# Patient Record
Sex: Male | Born: 1975 | Race: White | Hispanic: No | Marital: Married
Health system: Southern US, Community
[De-identification: ages and names within clinical notes are randomized; demographics above are authoritative.]

---

## 2009-03-02 ENCOUNTER — Ambulatory Visit: Payer: Self-pay | Admitting: Occupational Medicine

## 2009-03-13 ENCOUNTER — Ambulatory Visit: Payer: Self-pay | Admitting: Family Medicine

## 2010-06-17 ENCOUNTER — Ambulatory Visit (INDEPENDENT_AMBULATORY_CARE_PROVIDER_SITE_OTHER): Payer: BC Managed Care – PPO | Admitting: Family Medicine

## 2010-06-17 ENCOUNTER — Ambulatory Visit: Payer: BC Managed Care – PPO | Admitting: Family Medicine

## 2010-06-17 ENCOUNTER — Encounter: Payer: Self-pay | Admitting: Family Medicine

## 2010-06-17 DIAGNOSIS — L03039 Cellulitis of unspecified toe: Secondary | ICD-10-CM

## 2010-06-22 NOTE — Assessment & Plan Note (Signed)
Summary: LEFT BIG TOE rm 4   Vital Signs:  Patient Profile:   35 Years Old Male CC:      LT great toe pain Height:     71 inches Weight:      271.75 pounds O2 treatment:    Room Air Temp:     97.5 degrees F oral Pulse rate:   64 / minute Resp:     18 per minute BP sitting:   129 / 85  (left arm) Cuff size:   large  Vitals Entered By: Clemens Catholic LPN (June 16, 1608 1:10 PM)                  Updated Prior Medication List: No Medications Current Allergies: No known allergies History of Present Illness Chief Complaint: LT great toe pain History of Present Illness:  Subjective:  Patient complains of two day history of pain in left first toe.  No history of trauma.  REVIEW OF SYSTEMS Constitutional Symptoms      Denies fever, chills, night sweats, weight loss, weight gain, and fatigue.  Eyes       Denies change in vision, eye pain, eye discharge, glasses, contact lenses, and eye surgery. Ear/Nose/Throat/Mouth       Denies hearing loss/aids, change in hearing, ear pain, ear discharge, dizziness, frequent runny nose, frequent nose bleeds, sinus problems, sore throat, hoarseness, and tooth pain or bleeding.  Respiratory       Denies dry cough, productive cough, wheezing, shortness of breath, asthma, bronchitis, and emphysema/COPD.  Cardiovascular       Denies murmurs, chest pain, and tires easily with exhertion.    Gastrointestinal       Denies stomach pain, nausea/vomiting, diarrhea, constipation, blood in bowel movements, and indigestion. Genitourniary       Denies painful urination, kidney stones, and loss of urinary control. Neurological       Denies paralysis, seizures, and fainting/blackouts. Musculoskeletal       Denies muscle pain, joint pain, joint stiffness, decreased range of motion, redness, swelling, muscle weakness, and gout.  Skin       Denies bruising, unusual mles/lumps or sores, and hair/skin or nail changes.  Psych       Denies mood changes,  temper/anger issues, anxiety/stress, speech problems, depression, and sleep problems. Other Comments: pt c/o LT great toe pain x 2days. no injury. he has not taken any OTC meds.   Past History:  Past Medical History: heart murmur  Past Surgical History: ACL Surgery  Family History: Reviewed history from 03/02/2009 and no changes required. Family History of Cardiovascular disorder  Social History: Reviewed history from 03/02/2009 and no changes required. Married Never Smoked Alcohol use-yes - 5-10 beer weekly Drug use-no Regular exercise-no   Objective:  No acute distress  Left first toe:  Mild swelling, erythema, and tenderness at lateral base of nail.  Not fluctuant, no drainage.  Toe has full range of motion  Assessment New Problems: PARONYCHIA, LEFT GREAT TOE (ICD-681.11)  PARONYCHIA; DOES NOT APPEAR INGROWN  Plan New Medications/Changes: CEPHALEXIN 500 MG TABS (CEPHALEXIN) One by mouth three times daily (every 8 hours)  #21 x 0, 06/17/2010, Donna Christen MD NAPROXEN 500 MG TABS (NAPROXEN) One by mouth two times a day pc  #20 x 0, 06/17/2010, Donna Christen MD  New Orders: Est. Patient Level III [96045] Planning Comments:   Begin warm soaks.  Naproxen two times a day; Keflex 500mg  Q8hr.  Given a Water quality scientist patient information and instruction sheet  on topic (also info sheet on ingrown toenails) Return for worsening pain/swelling.   The patient and/or caregiver has been counseled thoroughly with regard to medications prescribed including dosage, schedule, interactions, rationale for use, and possible side effects and they verbalize understanding.  Diagnoses and expected course of recovery discussed and will return if not improved as expected or if the condition worsens. Patient and/or caregiver verbalized understanding.  Prescriptions: CEPHALEXIN 500 MG TABS (CEPHALEXIN) One by mouth three times daily (every 8 hours)  #21 x 0   Entered and Authorized by:   Donna Christen  MD   Signed by:   Donna Christen MD on 06/17/2010   Method used:   Print then Give to Patient   RxID:   [id number] NAPROXEN 500 MG TABS (NAPROXEN) One by mouth two times a day pc  #20 x 0   Entered and Authorized by:   Donna Christen MD   Signed by:   Donna Christen MD on 06/17/2010   Method used:   Print then Give to Patient   RxID:   [id number]   Orders Added: 1)  Est. Patient Level III [84696]

## 2011-12-06 ENCOUNTER — Emergency Department (HOSPITAL_BASED_OUTPATIENT_CLINIC_OR_DEPARTMENT_OTHER): Payer: BC Managed Care – PPO

## 2011-12-06 ENCOUNTER — Encounter (HOSPITAL_BASED_OUTPATIENT_CLINIC_OR_DEPARTMENT_OTHER): Payer: Self-pay | Admitting: Emergency Medicine

## 2011-12-06 ENCOUNTER — Emergency Department (HOSPITAL_BASED_OUTPATIENT_CLINIC_OR_DEPARTMENT_OTHER)
Admission: EM | Admit: 2011-12-06 | Discharge: 2011-12-06 | Disposition: A | Discharge status: Home / Self Care | Payer: BC Managed Care – PPO | Attending: Emergency Medicine | Admitting: Emergency Medicine

## 2011-12-06 DIAGNOSIS — W219XXA Striking against or struck by unspecified sports equipment, initial encounter: Secondary | ICD-10-CM | POA: Insufficient documentation

## 2011-12-06 DIAGNOSIS — Y998 Other external cause status: Secondary | ICD-10-CM | POA: Insufficient documentation

## 2011-12-06 DIAGNOSIS — S43006A Unspecified dislocation of unspecified shoulder joint, initial encounter: Secondary | ICD-10-CM | POA: Insufficient documentation

## 2011-12-06 DIAGNOSIS — Y936A Activity, physical games generally associated with school recess, summer camp and children: Secondary | ICD-10-CM | POA: Insufficient documentation

## 2011-12-06 MED ORDER — MORPHINE SULFATE 4 MG/ML IJ SOLN
INTRAMUSCULAR | Status: AC
  Administered 2011-12-06: 4 mg via INTRAVENOUS
  Filled 2011-12-06: qty 1

## 2011-12-06 MED ORDER — MORPHINE SULFATE 4 MG/ML IJ SOLN
4.0000 mg | Freq: Once | INTRAMUSCULAR | Status: AC
  Administered 2011-12-06: 4 mg via INTRAVENOUS

## 2011-12-06 MED ORDER — LIDOCAINE HCL 2 % IJ SOLN
INTRAMUSCULAR | Status: AC
  Administered 2011-12-06: 20 mg
  Filled 2011-12-06: qty 1

## 2011-12-06 NOTE — ED Notes (Signed)
Pt reports he dislocated his shoulder while dipping down.

## 2011-12-06 NOTE — ED Provider Notes (Signed)
History     CSN: [id number]  Arrival date & time 12/06/11  1158   First MD Initiated Contact with Patient 12/06/11 1211      Chief Complaint  Patient presents with  . Shoulder Pain    (Consider location/radiation/quality/duration/timing/severity/associated sxs/prior treatment) The history is provided by the patient.  Jimmy Gallegos is a 36 y.o. male here with L shoulder pain. Was playing dodge ball and was diving for the ball and fell with outstretch hand around 30 minutes prior to arrival. He heard a pop and his L shoulder started hurting. No numbness or weakness of hands. No head injury or LOC. No other injuries.    History reviewed. No pertinent past medical history.  History reviewed. No pertinent past surgical history.  History reviewed. No pertinent family history.  History  Substance Use Topics  . Smoking status: Not on file  . Smokeless tobacco: Not on file  . Alcohol Use: Yes      Review of Systems  Musculoskeletal:       L shoulder pain  All other systems reviewed and are negative.    Allergies  Review of patient's allergies indicates no known allergies.  Home Medications  No current outpatient prescriptions on file.  BP 134/83  Pulse 90  Temp 97.8 F (36.6 C) (Oral)  Resp 24  Ht 5\' 11"  (1.803 m)  Wt 215 lb (97.523 kg)  BMI 29.99 kg/m2  SpO2 97%  Physical Exam  Nursing note and vitals reviewed. Constitutional: He is oriented to person, place, and time.       Uncomfortable, holding L shoulder  Eyes: Conjunctivae are normal. Pupils are equal, round, and reactive to light.  Neck: Normal range of motion. Neck supple.  Cardiovascular: Normal rate, regular rhythm and normal heart sounds.   Pulmonary/Chest: Effort normal and breath sounds normal.  Abdominal: Soft. Bowel sounds are normal.  Musculoskeletal:       L shoulder appears to be anterior and inferiorly displaced. No tenderness over AC joint. + sensation over deltoid. 2+ pulses in that  extremity. Unable to adduct shoulder from pain. Nl ROM of L elbow and wrist and no obvious step offs. Neurovascular intact otherwise in all extremities.   Neurological: He is alert and oriented to person, place, and time.  Skin: Skin is warm and dry.  Psychiatric: He has a normal mood and affect. His behavior is normal. Judgment and thought content normal.    ED Course  Reduction of dislocation Date/Time: 12/06/2011 1:39 PM Performed by: Richardean Canal Authorized by: Richardean Canal Consent: Verbal consent obtained. Risks and benefits: risks, benefits and alternatives were discussed Consent given by: patient Patient understanding: patient states understanding of the procedure being performed Patient consent: the patient's understanding of the procedure matches consent given Procedure consent: procedure consent matches procedure scheduled Relevant documents: relevant documents present and verified Test results: test results available and properly labeled Site marked: the operative site was marked Imaging studies: imaging studies available Patient identity confirmed: verbally with patient and arm band Local anesthesia used: yes Anesthesia: local infiltration Local anesthetic: lidocaine 2% without epinephrine Anesthetic total: 10 ml Patient sedated: no Patient tolerance: Patient tolerated the procedure well with no immediate complications. Comments: I injected 10cc of 2% lido into the L shoulder joint for anesthesia. Procedure performed with no complications.    (including critical care time)  Labs Reviewed - No data to display Dg Shoulder Left  12/06/2011  *RADIOLOGY REPORT*  Clinical Data: Left shoulder dislocation,  status post reduction.  LEFT SHOULDER - 2+ VIEW  Comparison: 12/06/2011 at 12:44 p.m.  Findings: On the external rotated and axillary views, glenohumeral alignment has been restored, compatible with successful reduction. I do not observe a definite bony Bankart or injury or  Hill-Sachs impaction.  IMPRESSION:  1.  Successful reduction.  No discrete bony Bankart injury or Hill- Sachs impaction observed.   Original Report Authenticated By: Dellia Cloud, M.D.    Dg Shoulder Left  12/06/2011  *RADIOLOGY REPORT*  Clinical Data: Traumatic injury and pain  LEFT SHOULDER - 2+ VIEW  Comparison: None.  Findings: There is evidence of an anterior inferior dislocation of the humeral head with the respect to the glenoid labrum.  No definitive fracture is seen at this time.  IMPRESSION: The dislocation of the left humeral head.   Original Report Authenticated By: Phillips Odor, M.D.      1. Shoulder dislocation       MDM  Jimmy Gallegos is a 36 y.o. male here with L shoulder injury with xray showing L anterior shoulder dislocation. I reduced the shoulder as above. Repeat xray showed no fractures and shoulder relocated. Sling given, dc home with ortho f/u.         Richardean Canal, MD 12/06/11 1415

## 2011-12-06 NOTE — ED Notes (Signed)
Dr. Silverio Lay injects lidocaine and reduces left shoulder. Pt tolerated well. Post reduction films ordered.

## 2012-01-11 ENCOUNTER — Ambulatory Visit: Payer: BC Managed Care – PPO | Admitting: Physical Therapy

## 2013-10-03 IMAGING — CR DG SHOULDER 2+V*L*
2 series · 2 of 2 positions shown · non-contrast
Comparison: None.

CLINICAL DATA: Traumatic injury and pain

LEFT SHOULDER - 2+ VIEW

[w shoulder ap internal left]
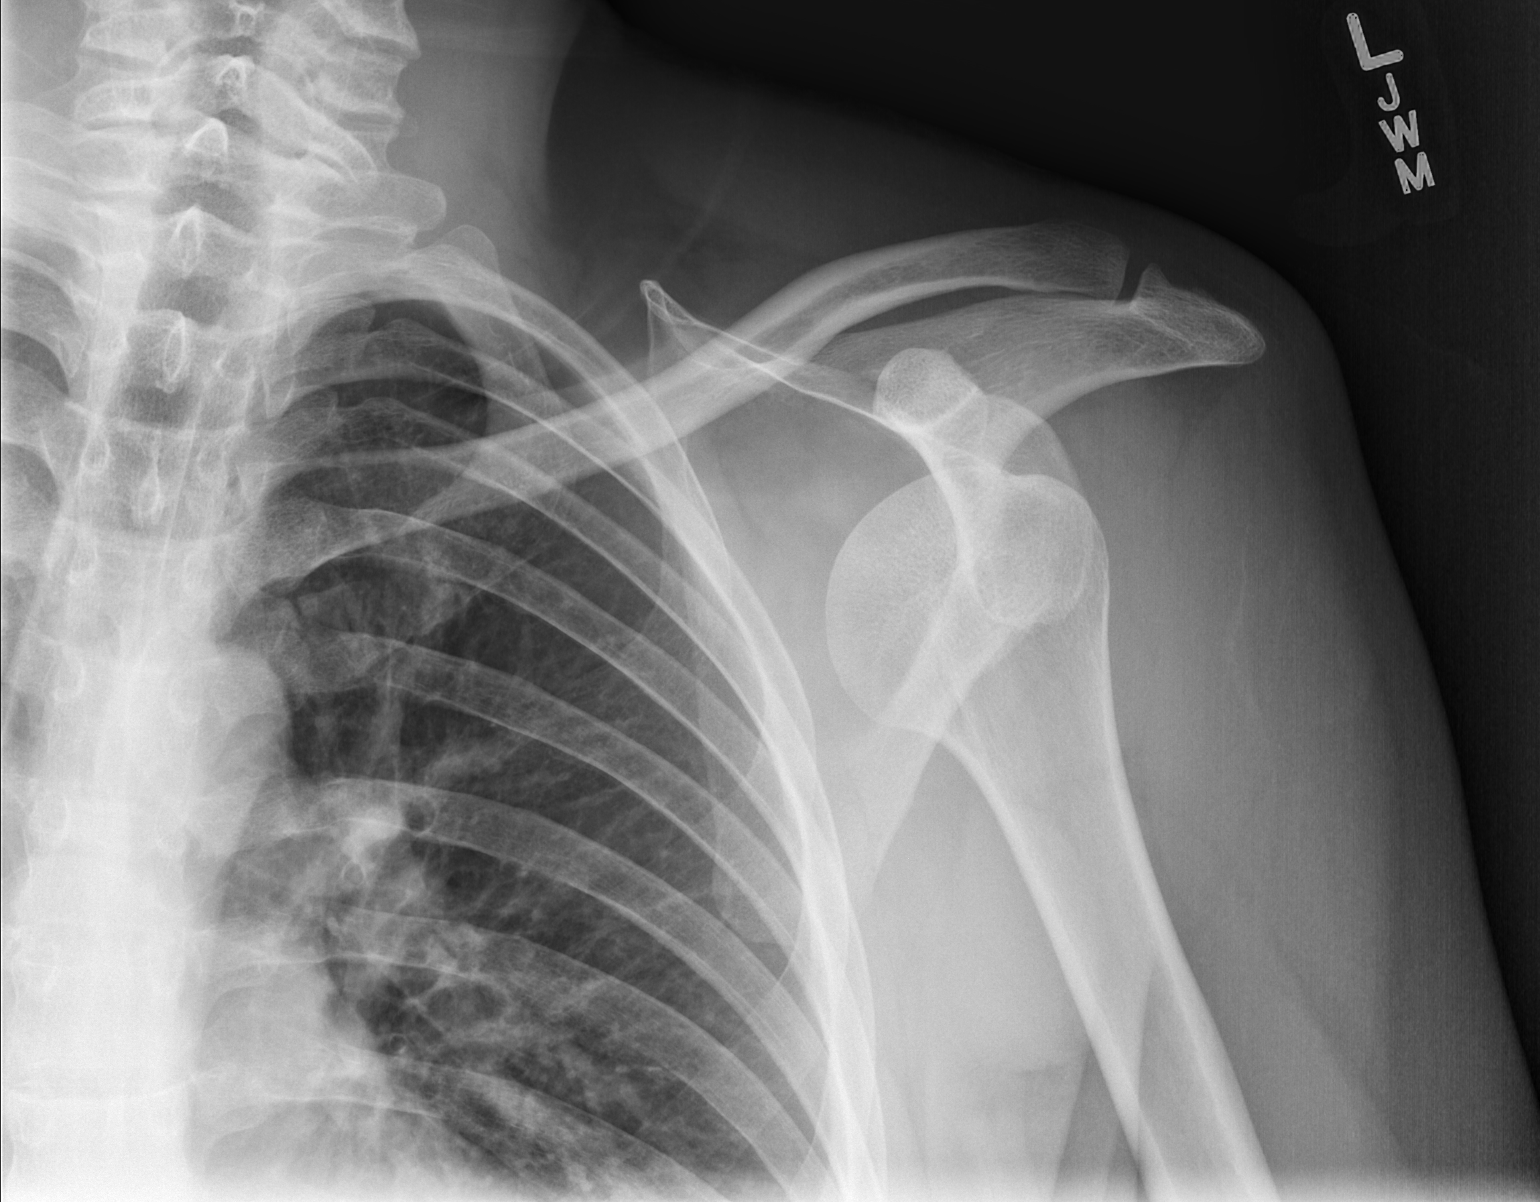

[w shoulder y view left]
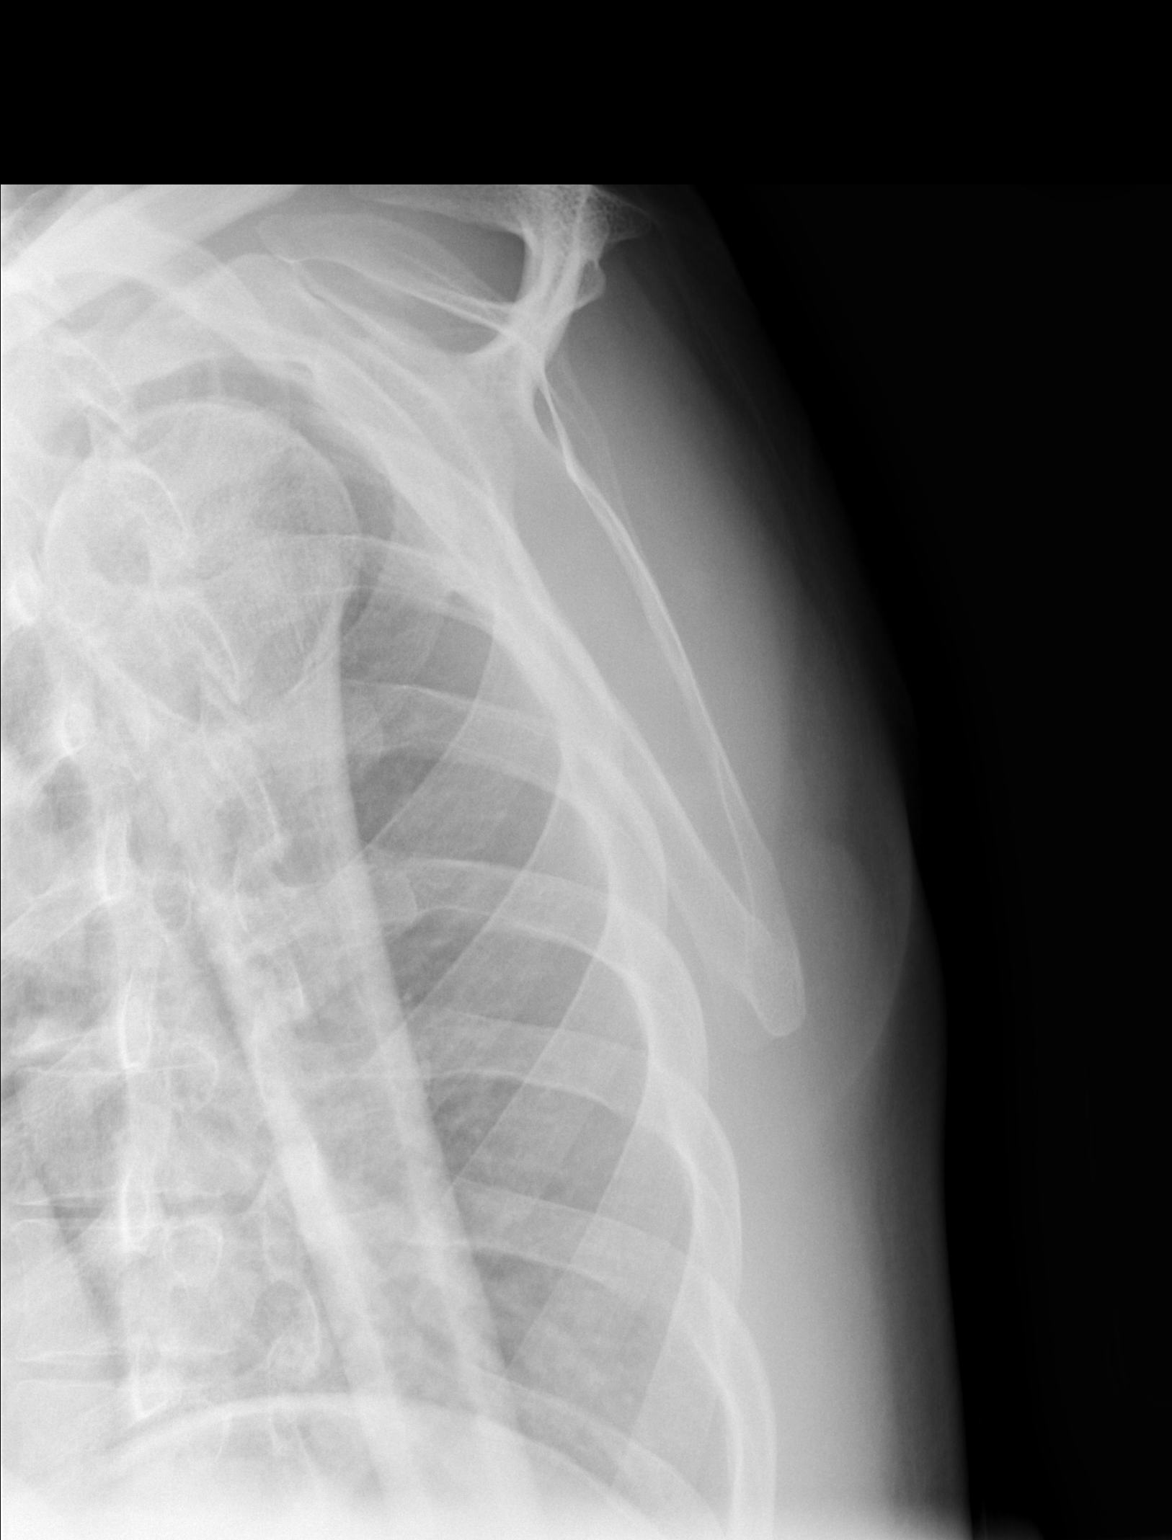

[2 of 2 positions shown; findings below may reference images not displayed]

FINDINGS: There is evidence of an anterior inferior dislocation of
the humeral head with the respect to the glenoid labrum.  No
definitive fracture is seen at this time.
IMPRESSION: The dislocation of the left humeral head.

## 2019-06-28 ENCOUNTER — Ambulatory Visit: Payer: Self-pay | Attending: Internal Medicine

## 2019-06-28 DIAGNOSIS — Z23 Encounter for immunization: Secondary | ICD-10-CM

## 2019-06-28 NOTE — Progress Notes (Signed)
   Covid-19 Vaccination Clinic  Name:  Jimmy Gallegos    MRN: [id number] DOB: 02-14-1976  06/28/2019  [name] was observed post Covid-19 immunization for 15 minutes without incident. He was provided with Vaccine Information Sheet and instruction to access the V-Safe system.   [name] was instructed to call 911 with any severe reactions post vaccine: Marland Kitchen Difficulty breathing  . Swelling of face and throat  . A fast heartbeat  . A bad rash all over body  . Dizziness and weakness   Immunizations Administered    Name Date Dose VIS Date Route   Pfizer COVID-19 Vaccine 06/28/2019  9:06 AM 0.3 mL 03/22/2019 Intramuscular   Manufacturer: [employer], [employer]   Lot: [id number]   NDC: [address]-0

## 2019-07-24 ENCOUNTER — Ambulatory Visit: Payer: Self-pay | Attending: Internal Medicine

## 2019-07-24 DIAGNOSIS — Z23 Encounter for immunization: Secondary | ICD-10-CM

## 2019-07-24 NOTE — Progress Notes (Signed)
   Covid-19 Vaccination Clinic  Name:  Jimmy Gallegos    MRN: [id number] DOB: 1975/05/16  07/24/2019  [name] was observed post Covid-19 immunization for 15 minutes without incident. He was provided with Vaccine Information Sheet and instruction to access the V-Safe system.   [name] was instructed to call 911 with any severe reactions post vaccine: Marland Kitchen Difficulty breathing  . Swelling of face and throat  . A fast heartbeat  . A bad rash all over body  . Dizziness and weakness   Immunizations Administered    Name Date Dose VIS Date Route   Pfizer COVID-19 Vaccine 07/24/2019  8:49 AM 0.3 mL 03/22/2019 Intramuscular   Manufacturer: [employer], [employer]   Lot: [id number]   NDC: [address]-6
